# Patient Record
Sex: Female | Born: 1935 | Race: White | Hispanic: No | Smoking: Former smoker
Health system: Southern US, Community
[De-identification: ages and names within clinical notes are randomized; demographics above are authoritative.]

## PROBLEM LIST (undated history)

## (undated) DIAGNOSIS — M1990 Unspecified osteoarthritis, unspecified site: Secondary | ICD-10-CM

## (undated) DIAGNOSIS — M3500 Sicca syndrome, unspecified: Secondary | ICD-10-CM

## (undated) DIAGNOSIS — I10 Essential (primary) hypertension: Secondary | ICD-10-CM

---

## 2018-03-16 ENCOUNTER — Other Ambulatory Visit: Payer: Self-pay

## 2018-03-16 ENCOUNTER — Emergency Department (HOSPITAL_BASED_OUTPATIENT_CLINIC_OR_DEPARTMENT_OTHER)
Admission: EM | Admit: 2018-03-16 | Discharge: 2018-03-16 | Disposition: A | Discharge status: Home / Self Care | Payer: Medicare Other | Attending: Emergency Medicine | Admitting: Emergency Medicine

## 2018-03-16 ENCOUNTER — Encounter (HOSPITAL_BASED_OUTPATIENT_CLINIC_OR_DEPARTMENT_OTHER): Payer: Self-pay

## 2018-03-16 ENCOUNTER — Emergency Department (HOSPITAL_BASED_OUTPATIENT_CLINIC_OR_DEPARTMENT_OTHER): Payer: Medicare Other

## 2018-03-16 DIAGNOSIS — Z87891 Personal history of nicotine dependence: Secondary | ICD-10-CM | POA: Insufficient documentation

## 2018-03-16 DIAGNOSIS — I10 Essential (primary) hypertension: Secondary | ICD-10-CM | POA: Insufficient documentation

## 2018-03-16 DIAGNOSIS — M25511 Pain in right shoulder: Secondary | ICD-10-CM | POA: Diagnosis not present

## 2018-03-16 DIAGNOSIS — W01198A Fall on same level from slipping, tripping and stumbling with subsequent striking against other object, initial encounter: Secondary | ICD-10-CM | POA: Insufficient documentation

## 2018-03-16 HISTORY — DX: Unspecified osteoarthritis, unspecified site: M19.90

## 2018-03-16 HISTORY — DX: Sjogren syndrome, unspecified: M35.00

## 2018-03-16 HISTORY — DX: Essential (primary) hypertension: I10

## 2018-03-16 NOTE — ED Triage Notes (Signed)
Pt states she was falling-caught self-hit right shoulder on wall-NAD-steady gait-daughter with pt

## 2018-03-16 NOTE — Discharge Instructions (Signed)

## 2018-03-16 NOTE — ED Provider Notes (Signed)
Emergency Department Provider Note   I have reviewed the triage vital signs and the nursing notes.   HISTORY  Chief Complaint Shoulder Injury   HPI Sydney Owens is a 85 y.o. female with PMH of HTN and arthritis presents to the emergency department for evaluation of right shoulder pain after mechanical fall at home.  Patient states that she stumbled and tripped falling forward and hit her right shoulder into the wall.  She did not fall on her outstretched hand.  There was no head injury, loss of consciousness.  She is having some soreness over the right shoulder with pain when lifting the arm.  No pain in the hips or legs.  No abdominal or back pain.  No radiation of symptoms or modifying factors.  Patient denies numbness.   Past Medical History:  Diagnosis Date  . Arthritis   . Hypertension   . Sjogren's disease (HCC)     There are no active problems to display for this patient.   History reviewed. No pertinent surgical history.  Allergies Flagyl [metronidazole]  No family history on file.  Social History Social History   Tobacco Use  . Smoking status: Former Games developer  . Smokeless tobacco: Never Used  Substance Use Topics  . Alcohol use: Yes    Comment: one wine glass/wine  . Drug use: Never    Review of Systems  Constitutional: No fever/chills Eyes: No visual changes. ENT: No sore throat. Cardiovascular: Denies chest pain. Respiratory: Denies shortness of breath. Gastrointestinal: No abdominal pain.  No nausea, no vomiting.  No diarrhea.  No constipation. Genitourinary: Negative for dysuria. Musculoskeletal: Negative for back pain. Positive right shoulder pain.  Skin: Negative for rash. Neurological: Negative for headaches, focal weakness or numbness.  10-point ROS otherwise negative.  ____________________________________________   PHYSICAL EXAM:  VITAL SIGNS: ED Triage Vitals  Enc Vitals Group     BP 03/16/18 2033 (!) 161/76     Pulse Rate  03/16/18 2033 95     Resp 03/16/18 2033 20     Temp 03/16/18 2033 97.7 F (36.5 C)     Temp Source 03/16/18 2033 Oral     SpO2 03/16/18 2033 96 %     Weight 03/16/18 2035 131 lb (59.4 kg)     Height 03/16/18 2035 5\' 3"  (1.6 m)     Pain Score 03/16/18 2031 7   Constitutional: Alert and oriented. Well appearing and in no acute distress. Eyes: Conjunctivae are normal. PERRL.  Head: Atraumatic. Nose: No congestion/rhinnorhea. Mouth/Throat: Mucous membranes are moist.  Neck: No stridor. No cervical spine tenderness to palpation. Cardiovascular: Normal rate, regular rhythm. Good peripheral circulation. Grossly normal heart sounds.   Respiratory: Normal respiratory effort.  No retractions. Lungs CTAB. Gastrointestinal: Soft and nontender. No distention.  Musculoskeletal: No lower extremity tenderness nor edema. No gross deformities of extremities.  Tenderness to palpation over the right lateral shoulder and right trapezius.  No midline cervical spine tenderness.  No pain in the elbow or wrist.  Normal range of motion of these joints with range of motion of the right shoulder slightly limited by pain. Neurologic:  Normal speech and language. No gross focal neurologic deficits are appreciated.  Skin:  Skin is warm, dry and intact. No rash noted.  ____________________________________________  RADIOLOGY  Dg Shoulder Right  Result Date: 03/16/2018 CLINICAL DATA:  Right shoulder pain after tripping injury. Fell on outstretched hand. EXAM: RIGHT SHOULDER - 2+ VIEW COMPARISON:  None. FINDINGS: There is no evidence of fracture  or dislocation. Mild AC and glenohumeral joint osteoarthritis with slight joint space narrowing. Soft tissues are unremarkable. IMPRESSION: No acute fracture or malalignment of the right shoulder. If there is pain out of proportion to radiographic findings, CT may help evaluate for radiographically occult fractures. Electronically Signed   By: Tollie Eth M.D.   On: 03/16/2018  21:38    ____________________________________________   PROCEDURES  Procedure(s) performed:   Procedures  None ____________________________________________   INITIAL IMPRESSION / ASSESSMENT AND PLAN / ED COURSE  Pertinent labs & imaging results that were available during my care of the patient were reviewed by me and considered in my medical decision making (see chart for details).  Patient presents to the emergency department the right shoulder pain after mechanical fall.  She has slightly limited range of motion based on pain.  X-ray of the right shoulder does not show bony abnormality.  Patient's pain is not out of proportion to what I would expect given her mechanism.  My suspicion for occult fracture is low.  No neurovascular compromise.  Provided an arm sling but advised removing it frequently for range of motion exercises and discussed the risk of frozen shoulder.  Plan for follow-up with sports medicine.  Patient to take Tylenol at home and provide heat application after icing this evening.    ____________________________________________  FINAL CLINICAL IMPRESSION(S) / ED DIAGNOSES  Final diagnoses:  Acute pain of right shoulder    Note:  This document was prepared using Dragon voice recognition software and may include unintentional dictation errors.  Alona Bene, MD Emergency Medicine    Madysin Crisp, Arlyss Repress, MD 03/17/18 [id number]

## 2020-01-27 IMAGING — DX DG SHOULDER 2+V*R*
3 series · 3 of 3 positions shown · non-contrast
Comparison: None.

CLINICAL DATA: Right shoulder pain after tripping injury. Fell on
outstretched hand.

EXAM:
RIGHT SHOULDER - 2+ VIEW

[shoulder grashey]
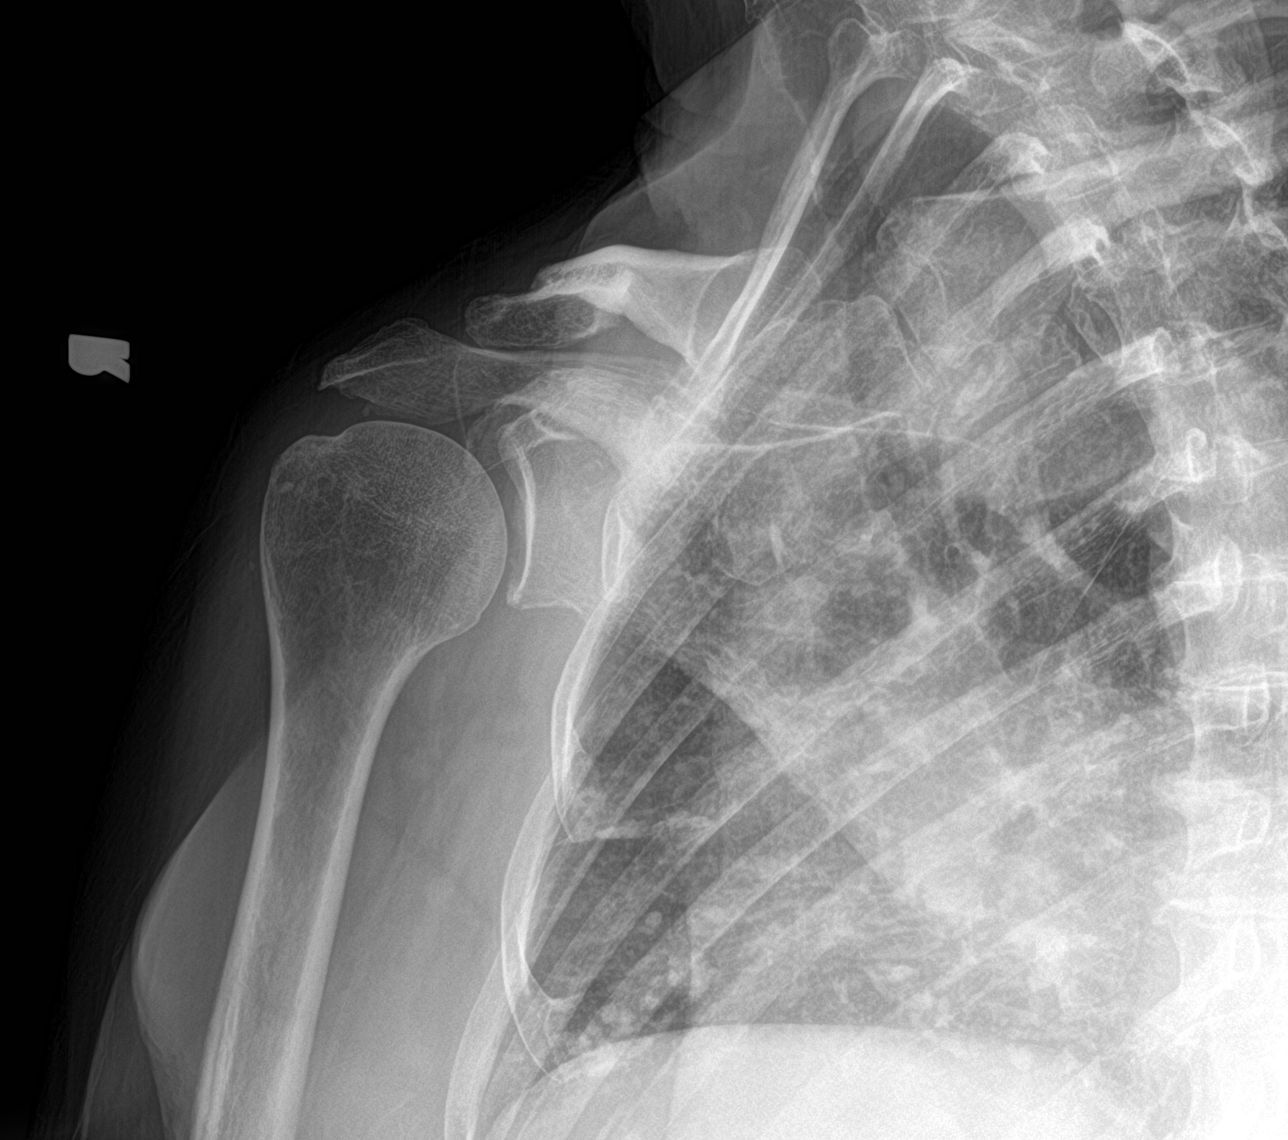

[shoulder y view]
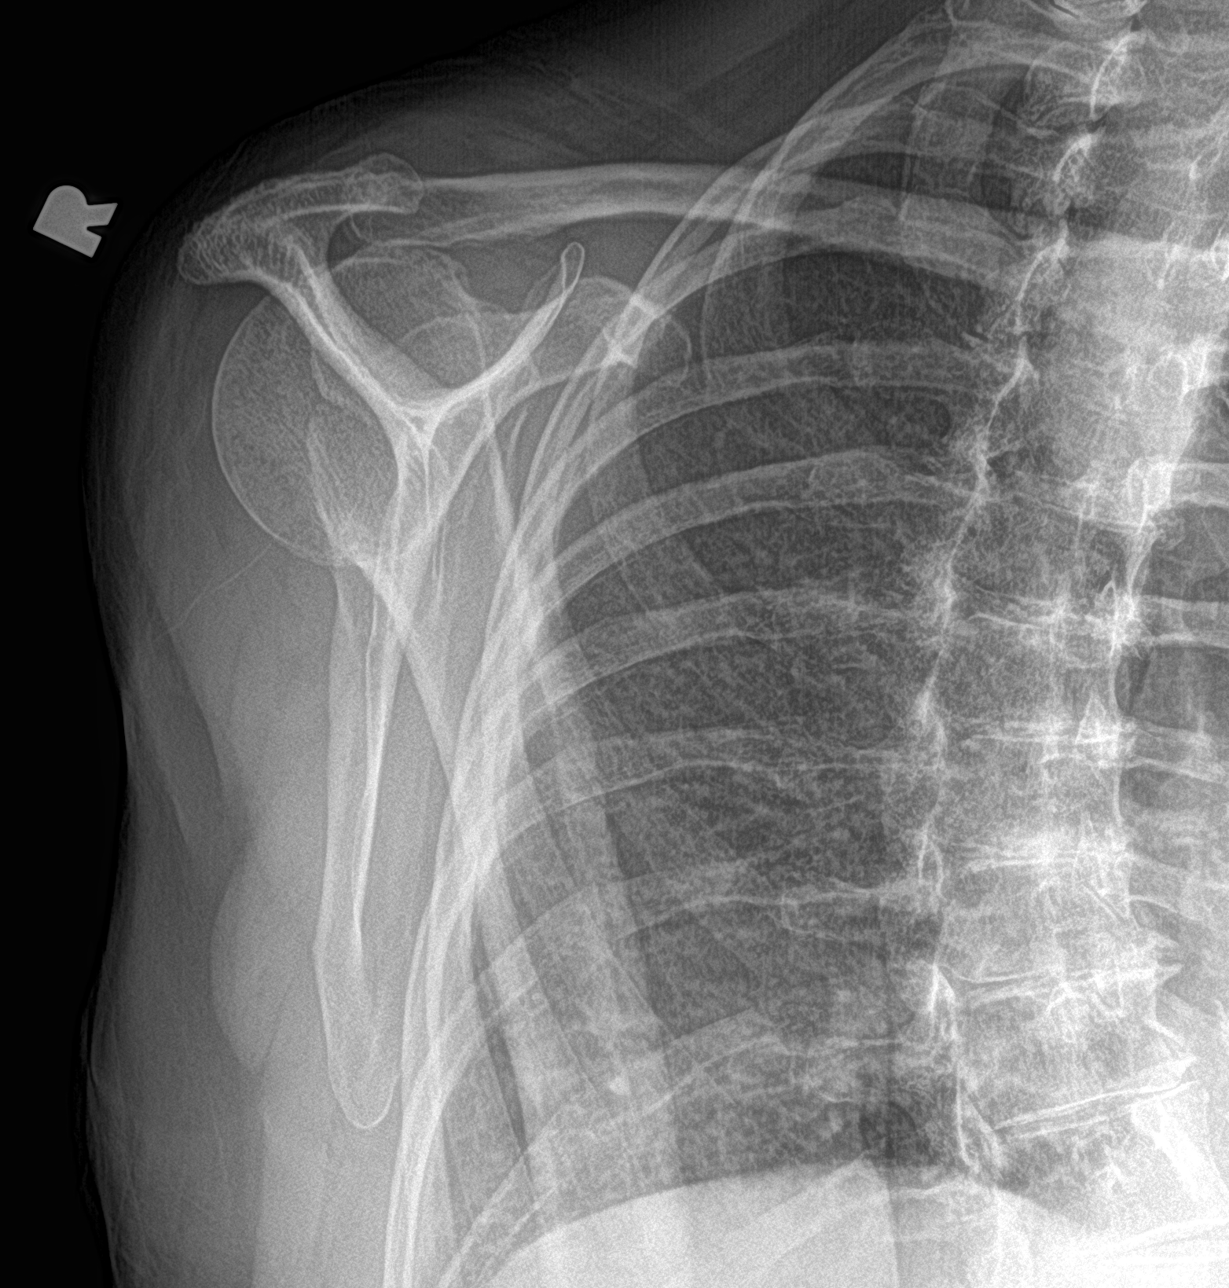

[shoulder axillary]
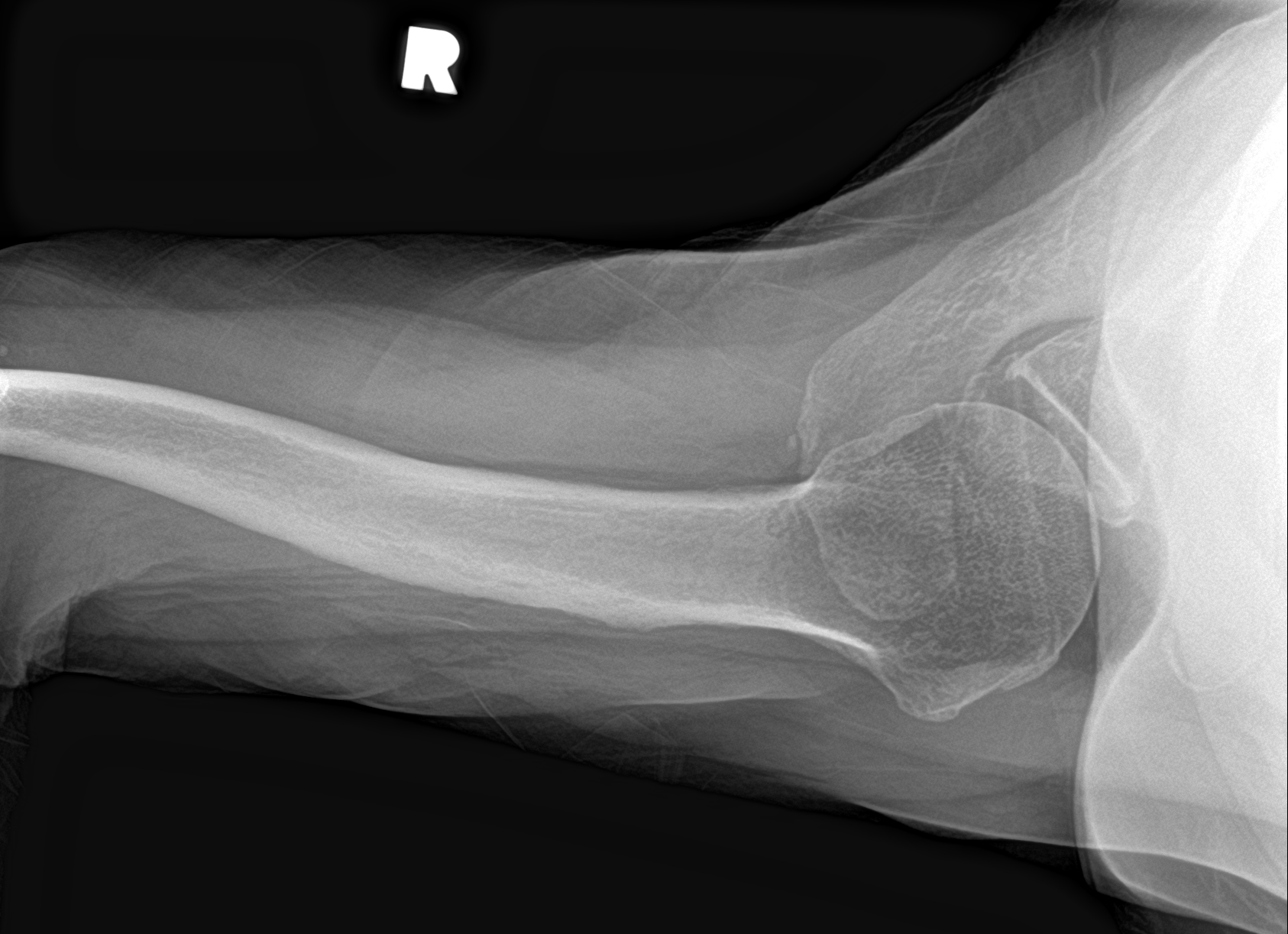

[3 of 3 positions shown; findings below may reference images not displayed]

FINDINGS: There is no evidence of fracture or dislocation. Mild AC and
glenohumeral joint osteoarthritis with slight joint space narrowing.
Soft tissues are unremarkable.
IMPRESSION: No acute fracture or malalignment of the right shoulder. If there is
pain out of proportion to radiographic findings, CT may help
evaluate for radiographically occult fractures.

## 2022-10-04 DEATH — deceased
